# Patient Record
Sex: Male | Born: 1935 | Race: White | Hispanic: No
Health system: Southern US, Community
[De-identification: ages and names within clinical notes are randomized; demographics above are authoritative.]

## PROBLEM LIST (undated history)

## (undated) DIAGNOSIS — Z992 Dependence on renal dialysis: Secondary | ICD-10-CM

## (undated) DIAGNOSIS — N289 Disorder of kidney and ureter, unspecified: Secondary | ICD-10-CM

## (undated) DIAGNOSIS — C801 Malignant (primary) neoplasm, unspecified: Secondary | ICD-10-CM

## (undated) DIAGNOSIS — I10 Essential (primary) hypertension: Secondary | ICD-10-CM

---

## 1998-09-05 ENCOUNTER — Emergency Department (HOSPITAL_COMMUNITY): Admission: EM | Admit: 1998-09-05 | Discharge: 1998-09-05 | Payer: Self-pay | Admitting: *Deleted

## 1998-09-07 ENCOUNTER — Inpatient Hospital Stay (HOSPITAL_COMMUNITY): Admission: EM | Admit: 1998-09-07 | Discharge: 1998-09-12 | Payer: Self-pay | Admitting: Emergency Medicine

## 1998-09-07 ENCOUNTER — Encounter: Payer: Self-pay | Admitting: Internal Medicine

## 2000-12-31 ENCOUNTER — Inpatient Hospital Stay (HOSPITAL_COMMUNITY): Admission: AD | Admit: 2000-12-31 | Discharge: 2001-01-03 | Payer: Self-pay | Admitting: Internal Medicine

## 2001-01-03 ENCOUNTER — Encounter: Payer: Self-pay | Admitting: Internal Medicine

## 2002-06-21 ENCOUNTER — Emergency Department (HOSPITAL_COMMUNITY): Admission: EM | Admit: 2002-06-21 | Discharge: 2002-06-21 | Payer: Self-pay | Admitting: Emergency Medicine

## 2002-06-21 ENCOUNTER — Encounter: Payer: Self-pay | Admitting: Emergency Medicine

## 2003-08-05 ENCOUNTER — Encounter: Payer: Self-pay | Admitting: Emergency Medicine

## 2003-08-05 ENCOUNTER — Inpatient Hospital Stay (HOSPITAL_COMMUNITY): Admission: EM | Admit: 2003-08-05 | Discharge: 2003-08-07 | Payer: Self-pay | Admitting: Emergency Medicine

## 2003-08-07 ENCOUNTER — Encounter: Payer: Self-pay | Admitting: Cardiology

## 2005-04-03 ENCOUNTER — Encounter (HOSPITAL_COMMUNITY): Admission: RE | Admit: 2005-04-03 | Discharge: 2005-07-01 | Payer: Self-pay | Admitting: Nephrology

## 2005-05-13 ENCOUNTER — Ambulatory Visit: Payer: Self-pay | Admitting: Gastroenterology

## 2005-05-29 ENCOUNTER — Ambulatory Visit: Payer: Self-pay | Admitting: Gastroenterology

## 2005-06-22 ENCOUNTER — Ambulatory Visit: Payer: Self-pay | Admitting: Surgery

## 2005-06-22 ENCOUNTER — Other Ambulatory Visit: Payer: Self-pay

## 2005-07-07 ENCOUNTER — Ambulatory Visit: Payer: Self-pay | Admitting: Cardiology

## 2005-07-16 ENCOUNTER — Ambulatory Visit: Payer: Self-pay | Admitting: Surgery

## 2005-07-20 ENCOUNTER — Inpatient Hospital Stay: Payer: Self-pay | Admitting: Surgery

## 2005-08-14 ENCOUNTER — Ambulatory Visit: Payer: Self-pay | Admitting: Family Medicine

## 2005-12-09 ENCOUNTER — Ambulatory Visit: Payer: Self-pay | Admitting: Family Medicine

## 2005-12-14 ENCOUNTER — Ambulatory Visit: Payer: Self-pay | Admitting: Urology

## 2005-12-29 ENCOUNTER — Ambulatory Visit: Payer: Self-pay | Admitting: Urology

## 2006-01-01 ENCOUNTER — Ambulatory Visit: Payer: Self-pay | Admitting: Urology

## 2006-03-24 ENCOUNTER — Encounter: Payer: Self-pay | Admitting: Neurology

## 2006-03-24 ENCOUNTER — Encounter: Payer: Self-pay | Admitting: Emergency Medicine

## 2006-07-23 ENCOUNTER — Ambulatory Visit: Payer: Self-pay

## 2006-12-29 ENCOUNTER — Other Ambulatory Visit: Payer: Self-pay

## 2006-12-29 ENCOUNTER — Emergency Department: Payer: Self-pay | Admitting: Emergency Medicine

## 2007-01-03 ENCOUNTER — Other Ambulatory Visit: Payer: Self-pay

## 2007-01-03 ENCOUNTER — Emergency Department: Payer: Self-pay | Admitting: Internal Medicine

## 2007-01-20 ENCOUNTER — Emergency Department: Payer: Self-pay | Admitting: Emergency Medicine

## 2007-01-20 ENCOUNTER — Other Ambulatory Visit: Payer: Self-pay

## 2007-01-21 ENCOUNTER — Other Ambulatory Visit: Payer: Self-pay

## 2007-01-21 ENCOUNTER — Inpatient Hospital Stay: Payer: Self-pay | Admitting: Internal Medicine

## 2007-06-16 ENCOUNTER — Ambulatory Visit: Payer: Self-pay | Admitting: Orthopaedic Surgery

## 2007-07-07 ENCOUNTER — Ambulatory Visit: Payer: Self-pay | Admitting: Pain Medicine

## 2007-07-11 ENCOUNTER — Ambulatory Visit: Payer: Self-pay | Admitting: Pain Medicine

## 2007-09-02 ENCOUNTER — Ambulatory Visit: Payer: Self-pay | Admitting: Pain Medicine

## 2007-09-12 ENCOUNTER — Ambulatory Visit: Payer: Self-pay | Admitting: Pain Medicine

## 2007-10-11 ENCOUNTER — Ambulatory Visit: Payer: Self-pay | Admitting: Pain Medicine

## 2007-10-18 ENCOUNTER — Ambulatory Visit: Payer: Self-pay | Admitting: Family Medicine

## 2007-11-07 ENCOUNTER — Ambulatory Visit: Payer: Self-pay | Admitting: Pain Medicine

## 2007-12-12 ENCOUNTER — Encounter: Payer: Self-pay | Admitting: Internal Medicine

## 2007-12-25 ENCOUNTER — Encounter: Payer: Self-pay | Admitting: Internal Medicine

## 2008-03-28 ENCOUNTER — Ambulatory Visit: Payer: Self-pay | Admitting: Pain Medicine

## 2008-08-30 IMAGING — CR RIGHT HIP - COMPLETE 2+ VIEW
1 series · 2 of 2 positions shown · non-contrast
Comparison: none

REASON FOR EXAM: pain
COMMENTS:

PROCEDURE:     DXR - DXR HIP RIGHT COMPLETE  - December 29, 2006 [DATE]
RESULT:     Degenerative change is appreciated involving the RIGHT hip.
There does not appear to be evidence of fracture, dislocation or
malalignment.  There is osteophytosis along the acetabulum.

[Series 1: view not recorded · 0.17mm/px · 2 of 2 slices shown]
[im 1/2]
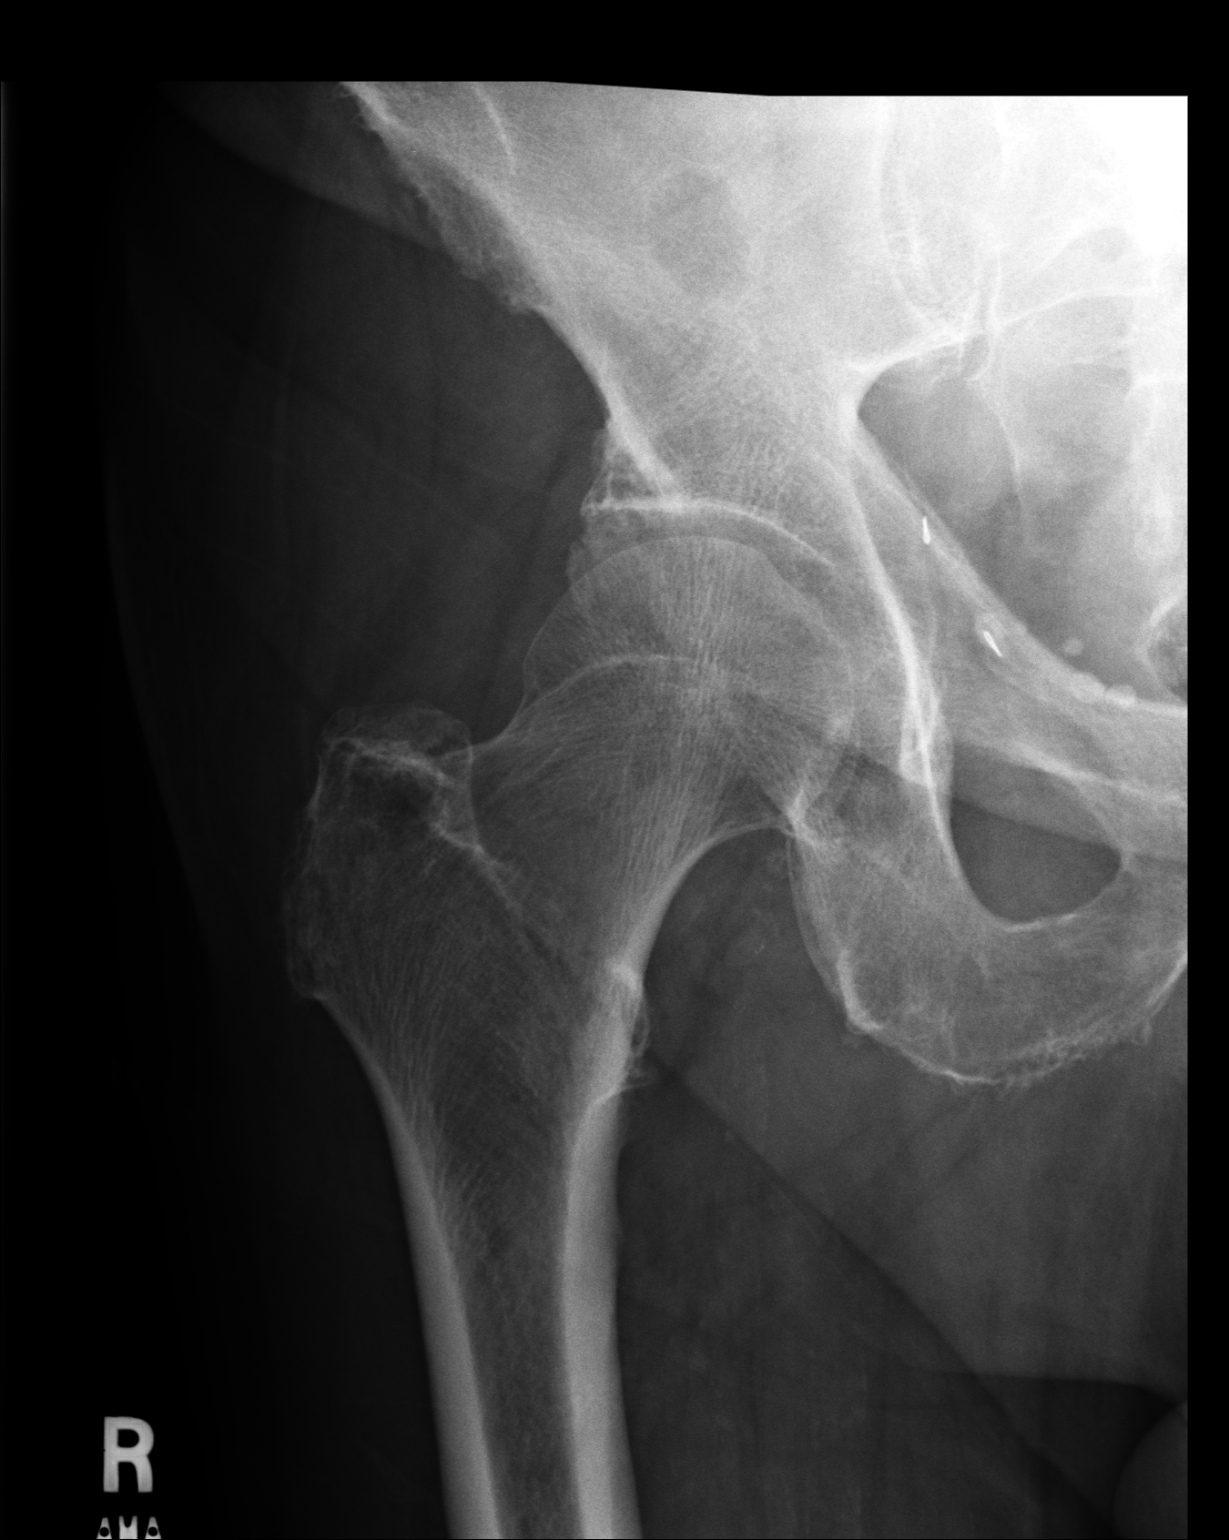
[im 2/2]
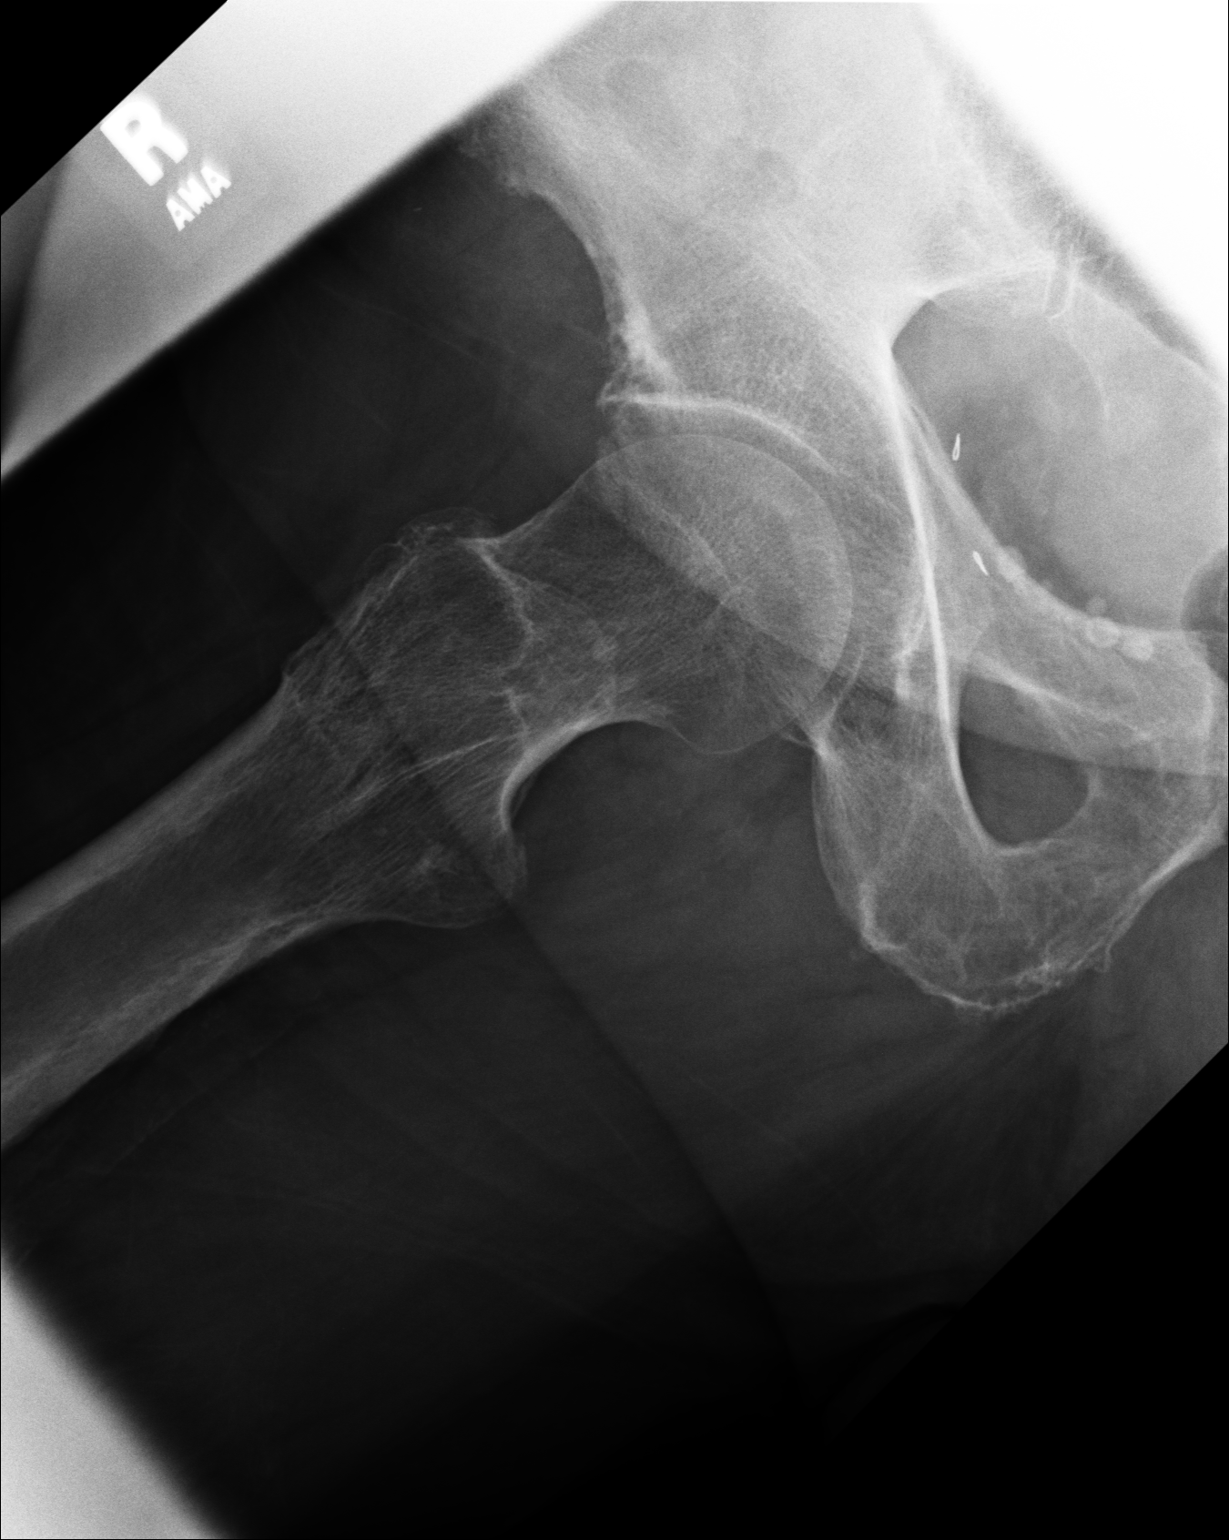

[2 of 2 positions shown; findings below may reference images not displayed]

IMPRESSION: 1)Degenerative changes without evidence of focal or acute osseous
abnormalities. If there are persistent complaints of pain or persistent
clinical concern, further evaluation with hip fracture protocol MRI is
recommended if clinically warranted.

## 2008-08-30 IMAGING — CT CT HEAD WITHOUT CONTRAST
2 series · 16 of 30 positions shown, 20 images · non-contrast
Comparison: none

REASON FOR EXAM: Right leg weakness
COMMENTS:

PROCEDURE:     CT  - CT HEAD WITHOUT CONTRAST  - December 29, 2006 [DATE]
RESULT:     Noncontrast head CT scan is performed.

[Series 2: without · axial · non-contrast · 0.40mm/px · z∈[-122,-2]mm · 13 of 28 slices shown, 17 images]
[im 2/28  brain]
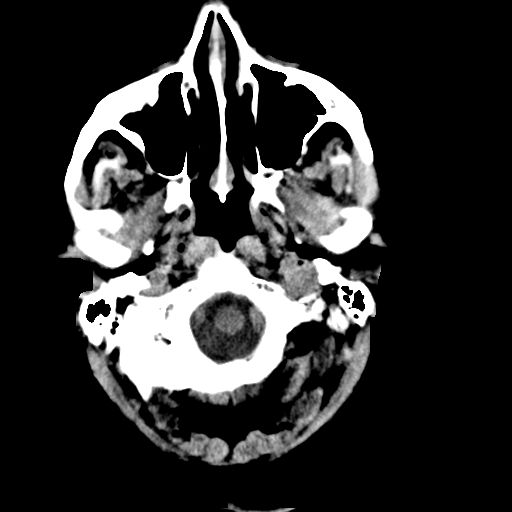
[im 2/28  bone]
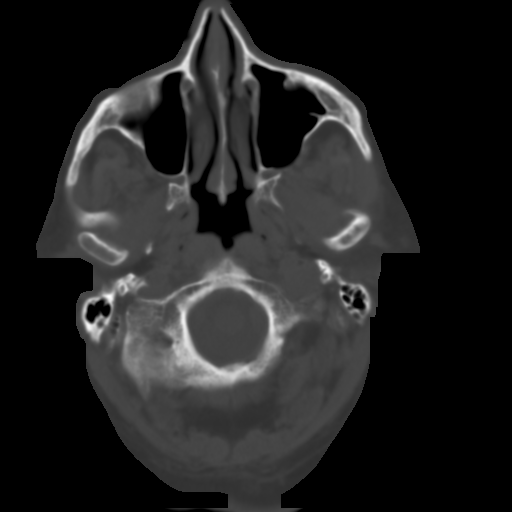
[im 4/28  brain]
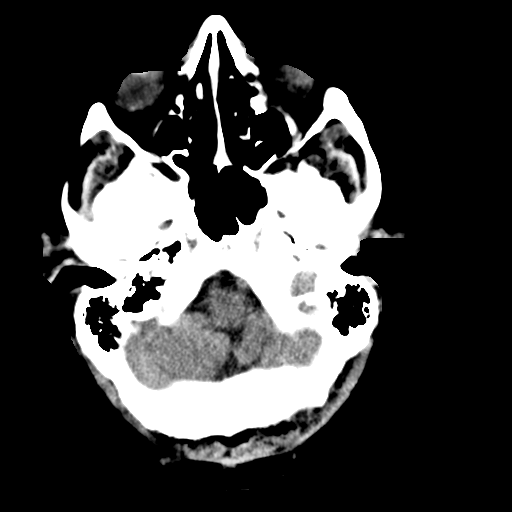
[im 6/28  brain]
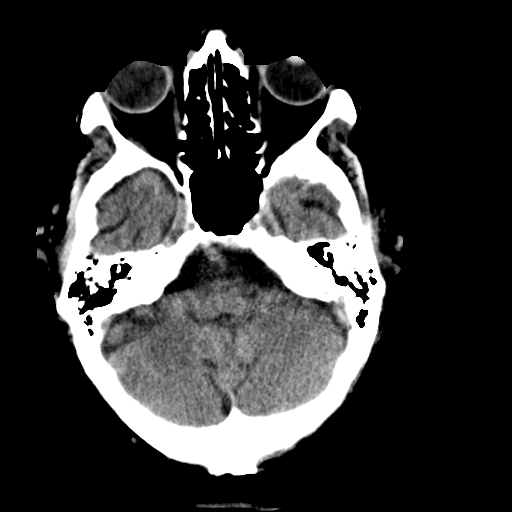
[im 8/28  brain]
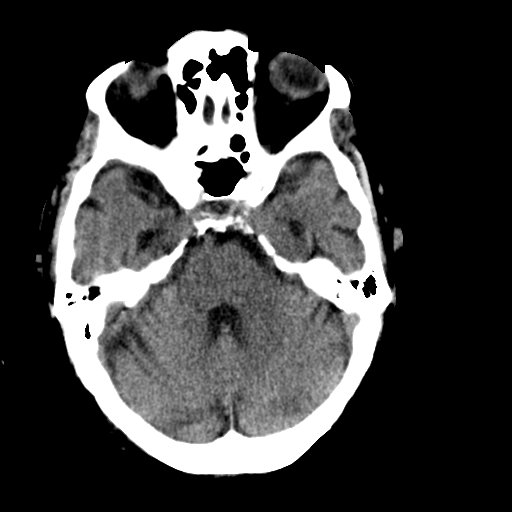
[im 10/28  brain]
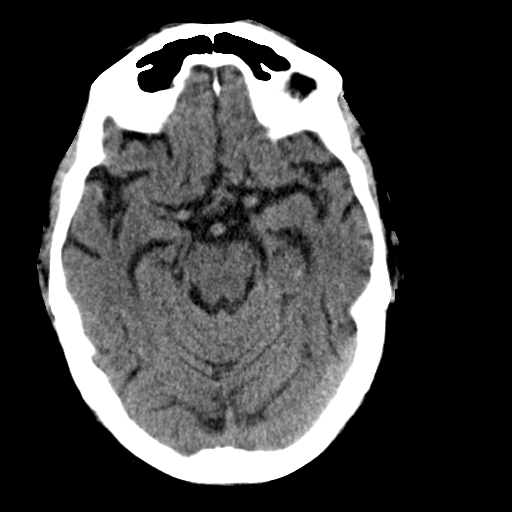
[im 10/28  bone]
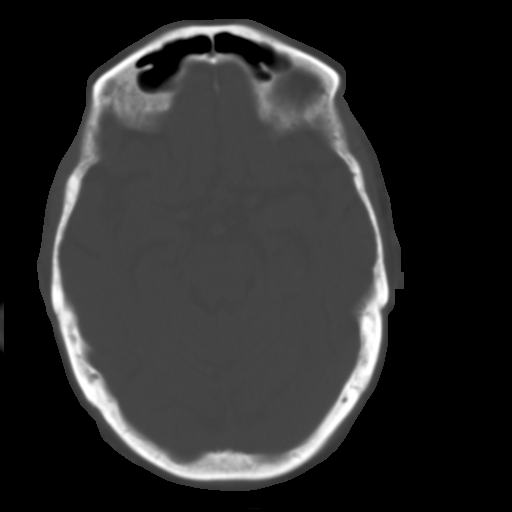
[im 12/28  brain]
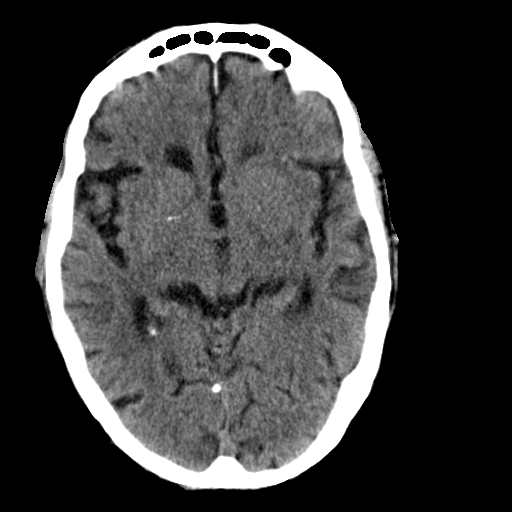
[im 14/28  brain]
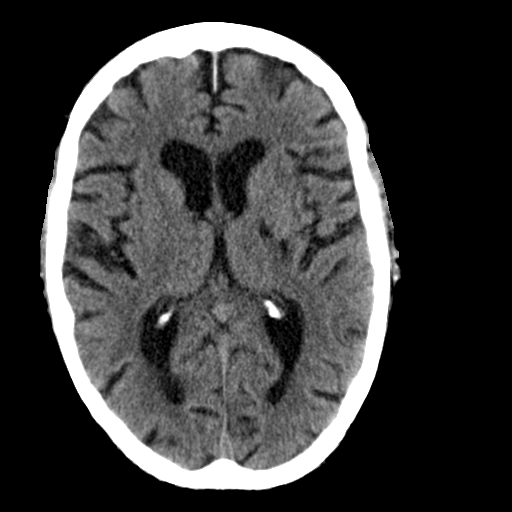
[im 16/28  brain]
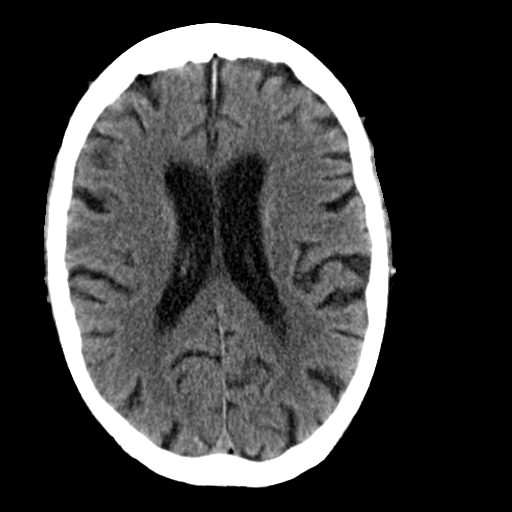
[im 18/28  brain]
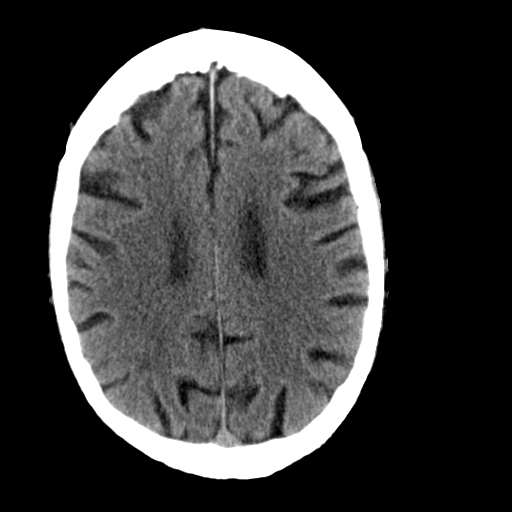
[im 18/28  bone]
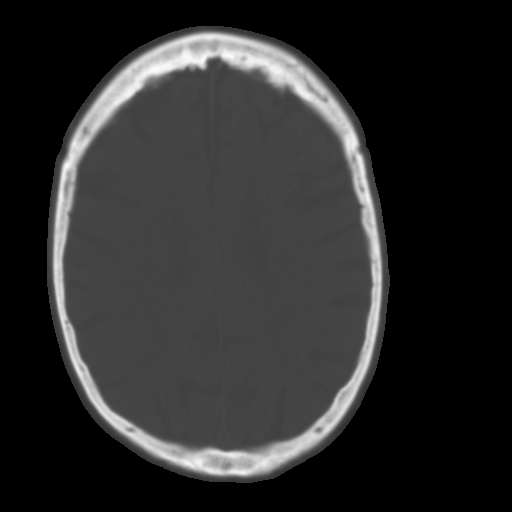
[im 20/28  brain]
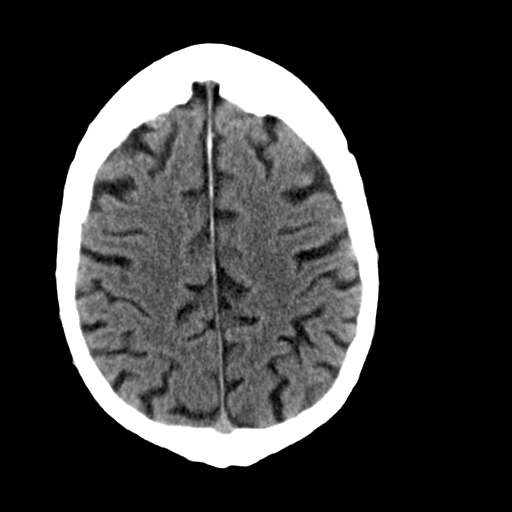
[im 22/28  brain]
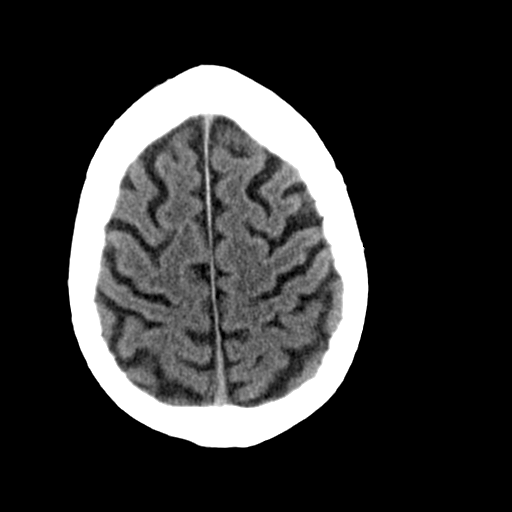
[im 24/28  brain]
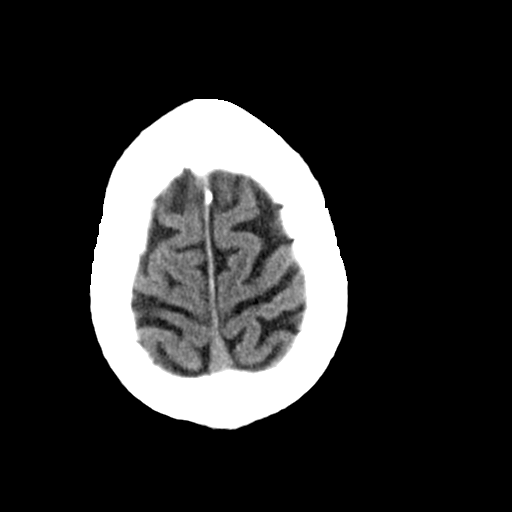
[im 26/28  brain]
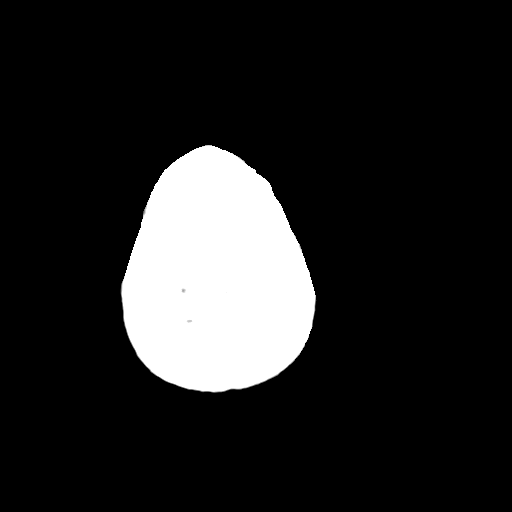
[im 26/28  bone]
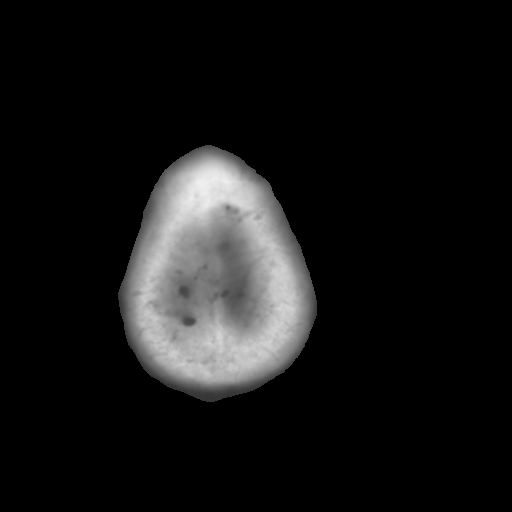

[Series 3: bone · axial · 0.40mm/px · z∈[-122,-82]mm · 3 of 28 slices shown]
[im 2/28  bone]
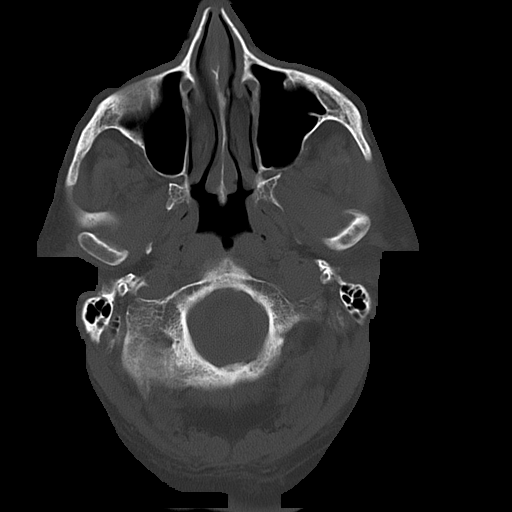
[im 6/28  bone]
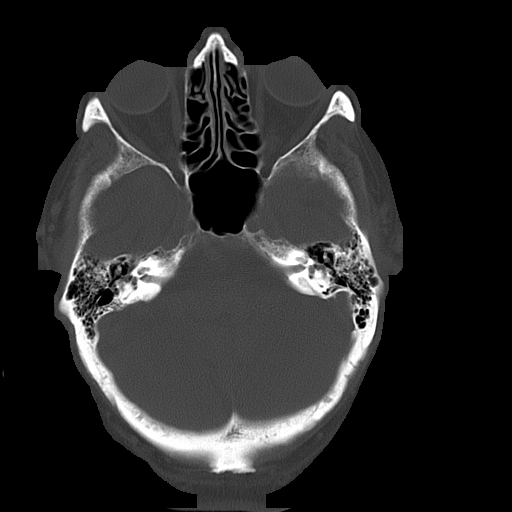
[im 10/28  bone]
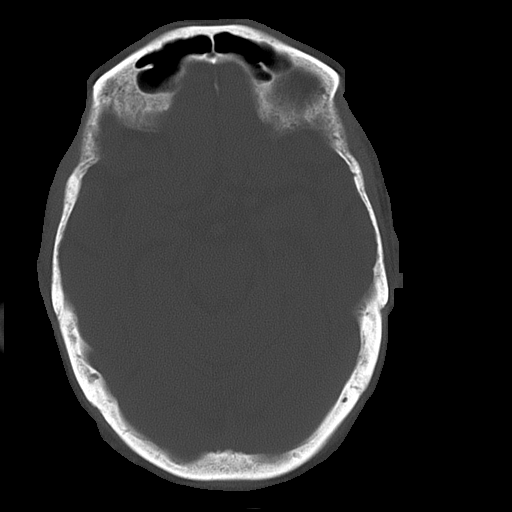

[16 of 30 positions shown; findings below may reference images not displayed]

FINDINGS: No acute bleeds or infarcts are identified. There are old, lacune
infarcts in the LEFT basal ganglia. There is no mass effect or shift of the
midline. There are noted changes of atrophy. No extra-axial fluid
collections are noted.

On the bone window settings, the mastoids and sinuses appear clear. No
obvious abnormalities are detected.
IMPRESSION: 1.     No acute abnormality is seen.
2.     Old, lacune infarcts in the LEFT basal ganglia.
3.     Changes of atrophy.

The report was called to Dr. Jhemboy in the Emergency Room at the conclusion
of dictation.

## 2008-10-07 ENCOUNTER — Inpatient Hospital Stay: Payer: Self-pay | Admitting: Internal Medicine

## 2008-10-31 ENCOUNTER — Ambulatory Visit: Payer: Self-pay | Admitting: Nephrology

## 2008-11-08 ENCOUNTER — Ambulatory Visit: Payer: Self-pay | Admitting: Pain Medicine

## 2009-10-09 ENCOUNTER — Inpatient Hospital Stay: Payer: Self-pay | Admitting: Internal Medicine

## 2009-10-21 ENCOUNTER — Emergency Department: Payer: Self-pay | Admitting: Emergency Medicine

## 2010-05-26 ENCOUNTER — Emergency Department: Payer: Self-pay | Admitting: Emergency Medicine

## 2011-03-12 ENCOUNTER — Ambulatory Visit: Payer: Self-pay | Admitting: Pain Medicine

## 2011-03-18 ENCOUNTER — Ambulatory Visit: Payer: Self-pay | Admitting: Pain Medicine

## 2011-04-07 ENCOUNTER — Other Ambulatory Visit (HOSPITAL_COMMUNITY): Payer: Self-pay | Admitting: MOHS-Micrographic Surgery

## 2011-04-07 DIAGNOSIS — IMO0002 Reserved for concepts with insufficient information to code with codable children: Secondary | ICD-10-CM

## 2011-04-16 ENCOUNTER — Ambulatory Visit (HOSPITAL_COMMUNITY)
Admission: RE | Admit: 2011-04-16 | Discharge: 2011-04-16 | Disposition: A | Discharge status: Home / Self Care | Payer: Medicare Other | Source: Ambulatory Visit | Attending: MOHS-Micrographic Surgery | Admitting: MOHS-Micrographic Surgery

## 2011-04-16 ENCOUNTER — Encounter (HOSPITAL_COMMUNITY): Payer: Self-pay

## 2011-04-16 ENCOUNTER — Other Ambulatory Visit (HOSPITAL_COMMUNITY): Payer: Self-pay | Admitting: MOHS-Micrographic Surgery

## 2011-04-16 DIAGNOSIS — IMO0002 Reserved for concepts with insufficient information to code with codable children: Secondary | ICD-10-CM

## 2011-04-16 DIAGNOSIS — M47812 Spondylosis without myelopathy or radiculopathy, cervical region: Secondary | ICD-10-CM | POA: Insufficient documentation

## 2011-04-16 DIAGNOSIS — C4442 Squamous cell carcinoma of skin of scalp and neck: Secondary | ICD-10-CM | POA: Insufficient documentation

## 2011-04-16 DIAGNOSIS — G319 Degenerative disease of nervous system, unspecified: Secondary | ICD-10-CM | POA: Insufficient documentation

## 2011-04-16 DIAGNOSIS — I7090 Unspecified atherosclerosis: Secondary | ICD-10-CM | POA: Insufficient documentation

## 2011-04-16 DIAGNOSIS — Z8673 Personal history of transient ischemic attack (TIA), and cerebral infarction without residual deficits: Secondary | ICD-10-CM | POA: Insufficient documentation

## 2011-04-16 DIAGNOSIS — M47814 Spondylosis without myelopathy or radiculopathy, thoracic region: Secondary | ICD-10-CM | POA: Insufficient documentation

## 2011-04-16 HISTORY — DX: Essential (primary) hypertension: I10

## 2011-04-16 HISTORY — DX: Malignant (primary) neoplasm, unspecified: C80.1

## 2011-04-16 HISTORY — DX: Dependence on renal dialysis: Z99.2

## 2011-04-16 HISTORY — DX: Disorder of kidney and ureter, unspecified: N28.9

## 2011-04-16 MED ORDER — IOHEXOL 300 MG/ML  SOLN
75.0000 mL | Freq: Once | INTRAMUSCULAR | Status: AC | PRN
  Administered 2011-04-16: 75 mL via INTRAVENOUS

## 2011-05-11 ENCOUNTER — Inpatient Hospital Stay: Payer: Self-pay | Admitting: Internal Medicine

## 2011-07-20 ENCOUNTER — Emergency Department: Payer: Self-pay | Admitting: Emergency Medicine

## 2011-07-25 ENCOUNTER — Ambulatory Visit: Payer: Self-pay | Admitting: Internal Medicine

## 2011-08-10 ENCOUNTER — Inpatient Hospital Stay: Payer: Self-pay | Admitting: Internal Medicine

## 2011-08-24 ENCOUNTER — Ambulatory Visit: Payer: Self-pay | Admitting: Internal Medicine

## 2011-11-26 ENCOUNTER — Emergency Department: Payer: Self-pay | Admitting: Emergency Medicine

## 2011-11-27 ENCOUNTER — Emergency Department: Payer: Self-pay | Admitting: Unknown Physician Specialty

## 2012-02-17 ENCOUNTER — Other Ambulatory Visit: Payer: Self-pay | Admitting: Nephrology

## 2012-04-04 ENCOUNTER — Inpatient Hospital Stay: Payer: Self-pay | Admitting: Specialist

## 2012-04-04 LAB — CBC
HCT: 31 % — ABNORMAL LOW (ref 40.0–52.0)
MCV: 105 fL — ABNORMAL HIGH (ref 80–100)
RBC: 2.94 10*6/uL — ABNORMAL LOW (ref 4.40–5.90)
RDW: 14.5 % (ref 11.5–14.5)
WBC: 9.3 10*3/uL (ref 3.8–10.6)

## 2012-04-04 LAB — COMPREHENSIVE METABOLIC PANEL
Alkaline Phosphatase: 73 U/L (ref 50–136)
Anion Gap: 11 (ref 7–16)
BUN: 23 mg/dL — ABNORMAL HIGH (ref 7–18)
Bilirubin,Total: 0.5 mg/dL (ref 0.2–1.0)
Calcium, Total: 8.6 mg/dL (ref 8.5–10.1)
Chloride: 96 mmol/L — ABNORMAL LOW (ref 98–107)
Co2: 31 mmol/L (ref 21–32)
Creatinine: 3.28 mg/dL — ABNORMAL HIGH (ref 0.60–1.30)
EGFR (African American): 18 — ABNORMAL LOW
EGFR (Non-African Amer.): 16 — ABNORMAL LOW
Glucose: 78 mg/dL (ref 65–99)
Osmolality: 278 (ref 275–301)
Potassium: 3.9 mmol/L (ref 3.5–5.1)
SGPT (ALT): 12 U/L
Total Protein: 7.5 g/dL (ref 6.4–8.2)

## 2012-04-04 LAB — AMMONIA: Ammonia, Plasma: <25 mcmol/L (ref 11–32)

## 2012-04-04 LAB — TROPONIN I: Troponin-I: <0.02 ng/mL

## 2012-04-04 LAB — CK TOTAL AND CKMB (NOT AT ARMC)
CK, Total: 49 U/L (ref 35–232)
CK-MB: 1.2 ng/mL (ref 0.5–3.6)

## 2012-04-04 LAB — APTT: Activated PTT: 49.6 secs — ABNORMAL HIGH (ref 23.6–35.9)

## 2012-04-04 LAB — PROTIME-INR: INR: 1.7

## 2012-04-05 LAB — CBC WITH DIFFERENTIAL/PLATELET
Basophil #: 0.1 10*3/uL (ref 0.0–0.1)
Basophil %: 0.7 %
Eosinophil #: 0.1 10*3/uL (ref 0.0–0.7)
Eosinophil %: 1.4 %
HCT: 29.4 % — ABNORMAL LOW (ref 40.0–52.0)
HGB: 9.8 g/dL — ABNORMAL LOW (ref 13.0–18.0)
Lymphocyte #: 1 10*3/uL (ref 1.0–3.6)
Lymphocyte %: 12 %
MCH: 35.1 pg — ABNORMAL HIGH (ref 26.0–34.0)
MCHC: 33.2 g/dL (ref 32.0–36.0)
MCV: 106 fL — ABNORMAL HIGH (ref 80–100)
Monocyte %: 11.1 %
Neutrophil #: 6.5 10*3/uL (ref 1.4–6.5)
Platelet: 313 10*3/uL (ref 150–440)
RBC: 2.78 10*6/uL — ABNORMAL LOW (ref 4.40–5.90)
RDW: 14.1 % (ref 11.5–14.5)
WBC: 8.7 10*3/uL (ref 3.8–10.6)

## 2012-04-05 LAB — BASIC METABOLIC PANEL
BUN: 31 mg/dL — ABNORMAL HIGH (ref 7–18)
Co2: 30 mmol/L (ref 21–32)
Creatinine: 4.31 mg/dL — ABNORMAL HIGH (ref 0.60–1.30)
Glucose: 88 mg/dL (ref 65–99)
Potassium: 3.8 mmol/L (ref 3.5–5.1)
Sodium: 136 mmol/L (ref 136–145)

## 2012-04-05 LAB — TSH: Thyroid Stimulating Horm: 1.51 u[IU]/mL

## 2012-04-10 LAB — CULTURE, BLOOD (SINGLE)

## 2012-05-23 DEATH — deceased

## 2012-12-16 IMAGING — CT CT HEAD WO/W CM
1 series · 15 of 30 positions shown, 19 images · IV contrast (omnipaque)
Comparison: None.

CT HEAD

CLINICAL DATA: Squamous cell carcinoma removed from the scalp.
199.1.

CT HEAD WITHOUT AND WITH CONTRAST
CT NECK WITH CONTRAST
TECHNIQUE: Contiguous axial images were obtained from the base of
the skull through the vertex without and with intravenous contrast.
Multidetector CT imaging of the neck was performed using the
standard protocol following the bolus administration of intravenous
contrast.
Contrast: 75 ml Omnipaque 300

[Series 2: head routine · axial · 0.43mm/px · z∈[+1166,+1298]mm · 15 of 30 slices shown, 19 images]
[im 2/30  brain]
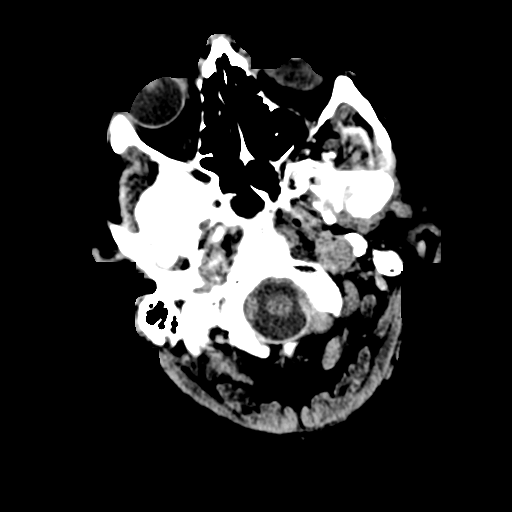
[im 2/30  bone]
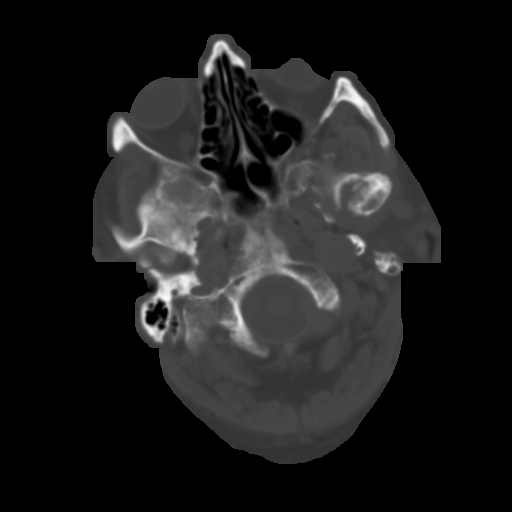
[im 4/30  brain]
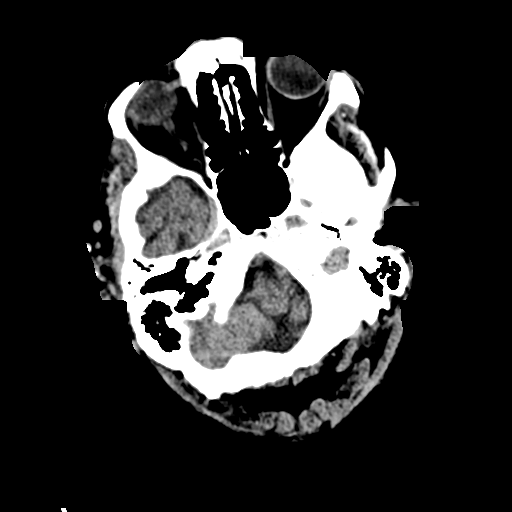
[im 6/30  brain]
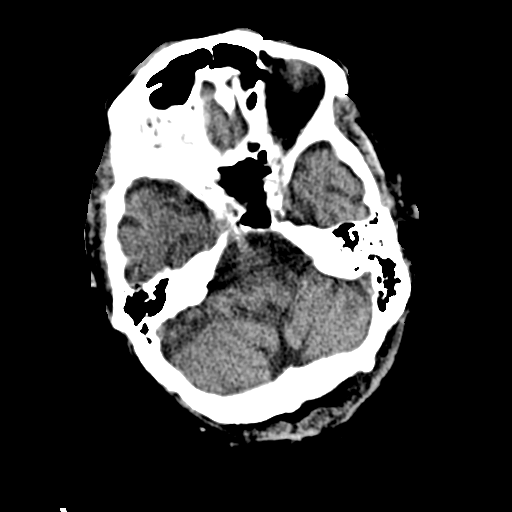
[im 8/30  brain]
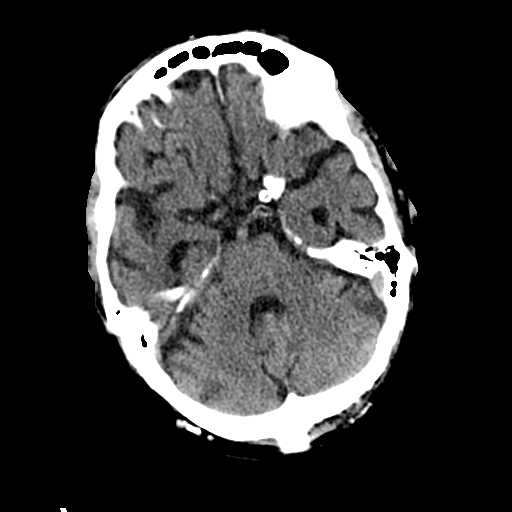
[im 10/30  brain]
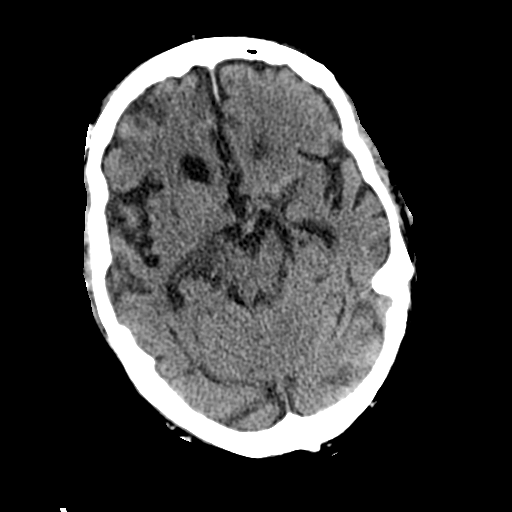
[im 10/30  bone]
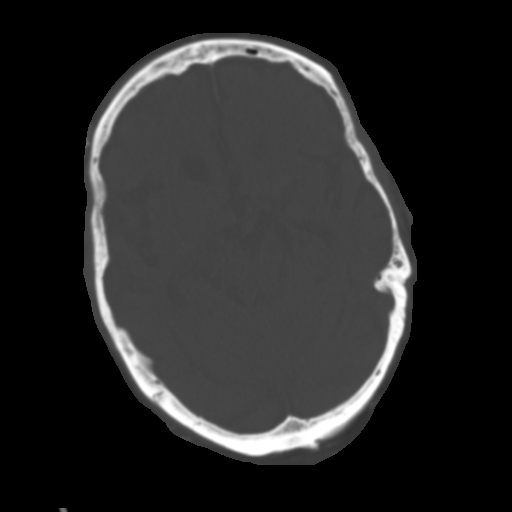
[im 12/30  brain]
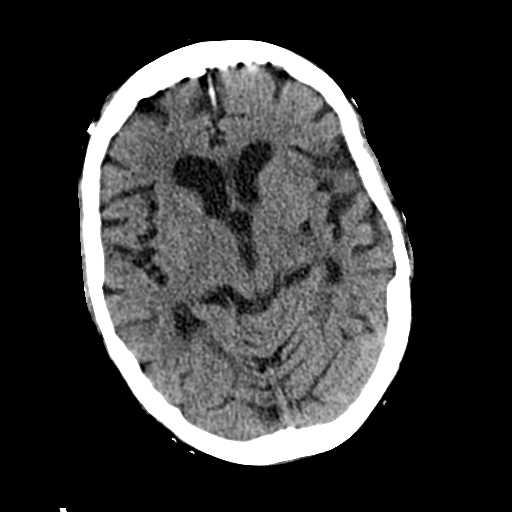
[im 14/30  brain]
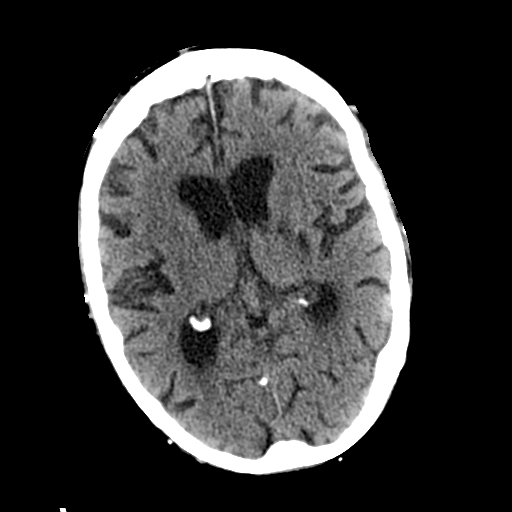
[im 16/30  brain]
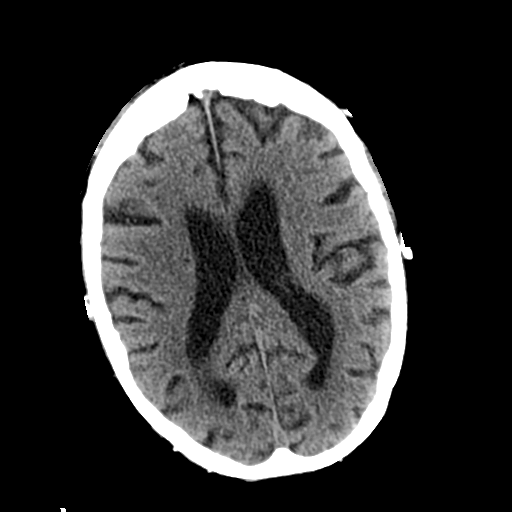
[im 17/30  brain]
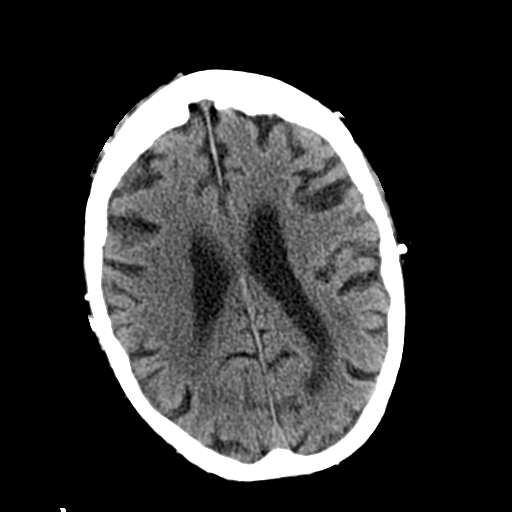
[im 17/30  bone]
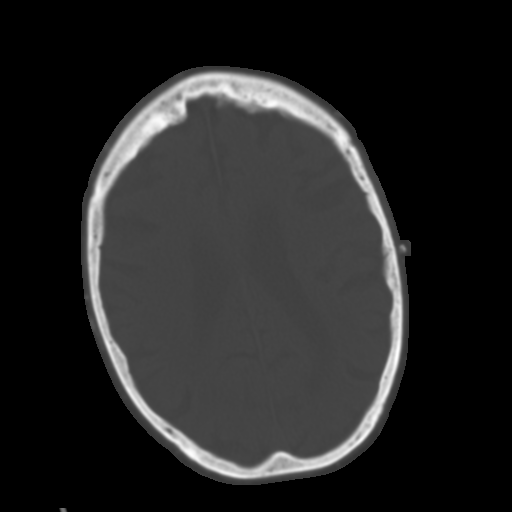
[im 19/30  brain]
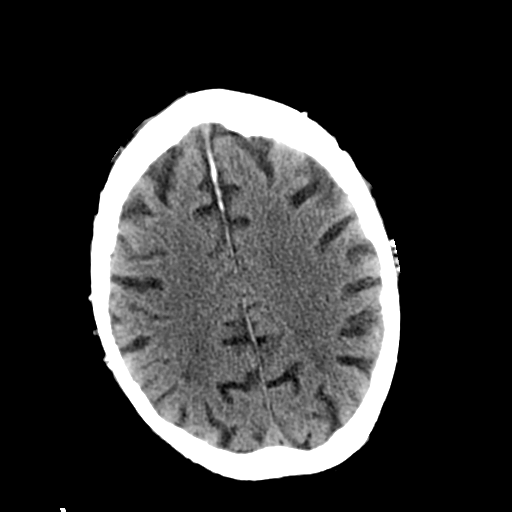
[im 21/30  brain]
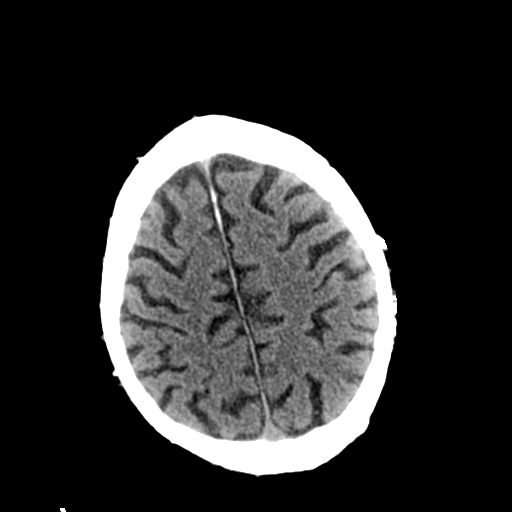
[im 23/30  brain]
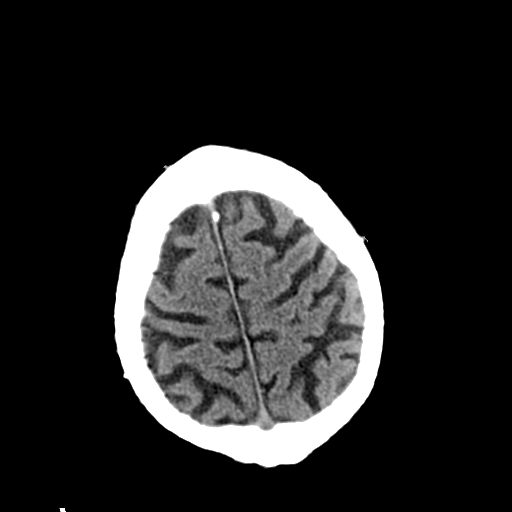
[im 25/30  brain]
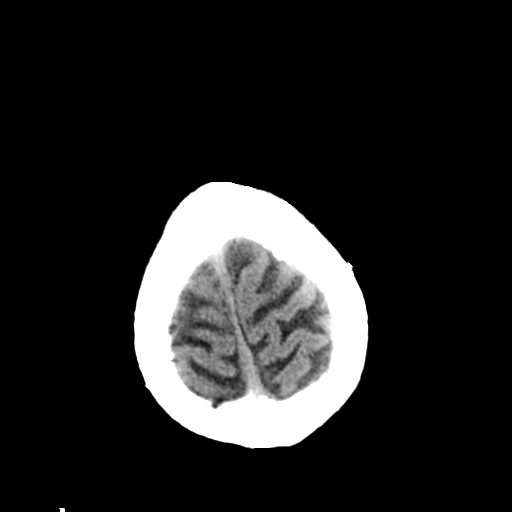
[im 25/30  bone]
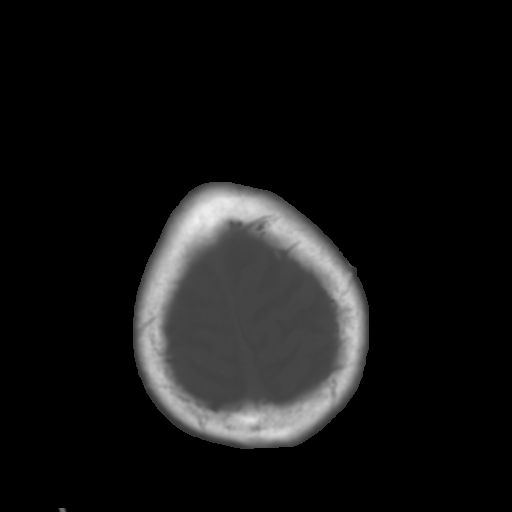
[im 27/30  brain]
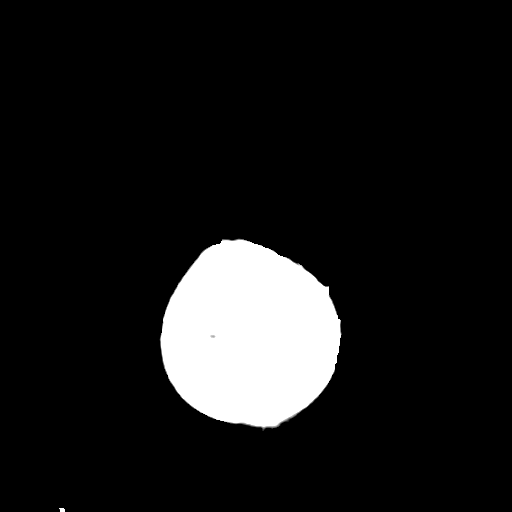
[im 29/30  brain]
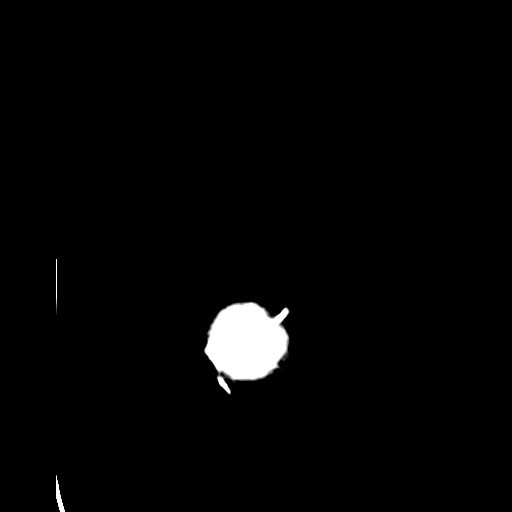

[15 of 30 positions shown; findings below may reference images not displayed]

FINDINGS: A remote lacunar infarcts are present in the left
lentiform nucleus and internal capsule.  Mild generalized atrophy
is likely within normal limits for age.  Mild white matter
hypoattenuation is present as well.  No acute cortical infarct,
hemorrhage, or mass lesion is present.

The postcontrast images demonstrate no focal areas of pathologic
enhancement to suggest metastatic disease.
IMPRESSION: 1.  No evidence for metastatic disease of the brain.
2.  Mild generalized atrophy is likely within normal limits for
age.
3.  Remote lacunar infarcts of the left basal ganglia.

CT NECK
FINDINGS: No focal mucosal or submucosal lesions are evident.  The
glottis is wide open.  No asymmetry is evident.

No pathologic lymph nodes are identified.  Atherosclerotic
calcifications are present in the proximal internal carotid
arteries bilaterally without significant stenosis by NASCET
criteria.  The proximal great vessels are tortuous, but without
focal stenosis.  The visualized lung apices are clear.

Diffuse endplate change and posterior osteophytes are present at C5-
6.  Asymmetric left-sided uncovertebral disease is present at C4-5.
Fused anterior osteophytes are noted within the upper thoracic
spine.
IMPRESSION: 1.  No evidence of metastatic disease to the neck.

2.  Atherosclerotic changes.
3.  Mild spondylosis of the cervical and upper thoracic spine as
described.

## 2015-03-17 NOTE — H&P (Signed)
PATIENT NAME:  Christian Short, WEBER MR#:  [id number] DATE OF BIRTH:  [birth year]  DATE OF ADMISSION:  04/04/2012  PRIMARY CARE PHYSICIAN: Dr. Apolonio Schneiders.   CHIEF COMPLAINT: Altered mental status.  HISTORY OF PRESENT ILLNESS:  An 81 year old male with a history of congestive heart failure, atrial fibrillation, dementia, diabetes, history of cerebrovascular accident, end-stage renal disease on hemodialysis, prostate cancer and questionable pancreatic mass who presents with the above complaint. The patient went  to dialysis today where they found him very lethargic. He was hypotensive and he had a decrease in his oxygen saturation. He also was very weak so he was brought here for further evaluation. In the Emergency Room, he had a chest x-ray which shows right pleural effusion. He does seem more disoriented from his baseline per the family. There is no fever, chills, or recent illness. No pulmonary symptoms such as cough or shortness of breath. Because the patient is a dialysis patient he makes very little urine. A urine sample is pending.   REVIEW OF SYSTEMS:  CONSTITUTIONAL: No fever. Positive fatigue and weakness. EYES: No blurred or double vision. ENT: Positive hearing loss. RESPIRATORY: No cough. No wheezing. No dyspnea. CARDIOVASCULAR: No chest pain, orthopnea, edema, palpitations, or syncope. GASTROINTESTINAL: No nausea, vomiting, diarrhea, abdominal pain, melena, or ulcers. GENITOURINARY: The patient makes very little urine. ENDOCRINE: No thyroid problems or increased sweating, polyuria or polydipsia. HEME/LYMPH: Positive anemia of chronic disease. SKIN: No rash or lesions. MUSCULOSKELETAL: Limited activity. He uses a walker. NEUROLOGIC: Positive history of cerebrovascular accident. PSYCH: Dementia and depression.   PAST MEDICAL HISTORY:  1. Chronic heart failure.  2. Questionable pancreatic mass. The family wished no further work-up.  3. End-stage renal disease on hemodialysis Monday, Wednesday,  Friday.  4. Hypertension.  5. History of diabetes.  6. Hyperlipidemia.  7. History of cerebrovascular accident.  8. History of prostate cancer status post prostatectomy.  9. Depression.  10. History of coronary artery disease. Cardiac catheterization in 2006 reveals 60% mid LAD, 40% RCA. Ejection fraction of 50%.  11. Atrial fibrillation, unable to tolerate Coumadin due to nosebleeds.  12. Chronic dizziness due to BPD.   PAST SURGICAL HISTORY:  1. Prostatectomy.  2. Left arm AV fistula.   MEDICATIONS:  1. Aspirin 81 mg daily.  2. Januvia 50 mg daily.  3. Renvela 1 tablet t.i.d.  4. Docusate 100 mg b.i.d. p.r.n.  5. Zoloft 150 mg daily.  6. Vitamin D2 50,000 international units daily.  7. Atorvastatin 40 mg daily. 8. Celebrex 200 mg daily.  9. Omeprazole 20 mg daily.  10. Oyster shell calcium 500 mg daily.  11. Nephrocaps 1 tablet daily.  12. Norvasc 5 mg daily.  13. Fish Oil 1000 mg b.i.d.  14. Gemfibrozil 600 b.i.d.  15. Metoprolol 50 mg daily.   SOCIAL HISTORY: No tobacco, alcohol, or drug use. He is a resident of [facility].   ALLERGIES: Opiates, Flexeril.   FAMILY HISTORY: Positive for diabetes and hypertension.   PHYSICAL EXAMINATION:  VITAL SIGNS: Temperature 97.9, pulse 74, respirations 20, blood pressure 101/61, 96% on room air.   GENERAL: The patient is alert. He is oriented to name, not place or time.   HEENT: Head is atraumatic. Pupils are round and reactive. Sclerae anicteric. Mucous membranes are dry. Oropharynx is clear.   NECK: Supple without jugular venous distention, carotid bruit or enlarged thyroid.   CARDIOVASCULAR: Regular rate and rhythm. No murmurs, gallops, or rubs. PMI is nondisplaced.   LUNGS: Clear to auscultation without  rales, crackles, or wheezing. He has decreased breath sounds at the right base.   BACK: No CVA or vertebral tenderness.   ABDOMEN: Bowel sounds positive. Nontender, nondistended. No hepatosplenomegaly.    EXTREMITIES: No clubbing, cyanosis, or edema.   NEUROLOGIC: Cranial nerves II through XII are intact. There are no focal deficits. Finger-to-nose is intact. Heel-to-shin is intact. Strength: Five out of five strength in all extremities.   SKIN: Without rashes or lesions. He has some abrasions on his face.   LABORATORY, RADIOLOGICAL AND DIAGNOSTIC DATA: ABG: PH 7.41. This is from a venous, pCO2 54, ammonia less than 25, white blood cells 9.3, hemoglobin 10.5, hematocrit 31, platelets 333, sodium 138, potassium 3.9, chloride 96, bicarbonate 31, BUN 23, creatinine 3.28, glucose 78, calcium 8.6, bilirubin 0.5, alkaline phosphatase 73, AST 23, ALT 12, total protein 7.6, albumin 2.6. INR is 1.7. CK 49, CK-MB 1.2, troponin less than 0.02. Chest x-ray shows a moderate size right pleural effusion. CT of the head shows no acute intracranial hemorrhage or cerebrovascular accident. EKG shows atrial fibrillation with competing junctional pacemaker.   ASSESSMENT AND PLAN: An 81 year old male with a history of congestive heart failure, atrial fibrillation, not on Coumadin, end-stage renal disease on hemodialysis who presents with altered mental status and hypotension from the dialysis.  1. Altered mental status. This could be possibly due to an infection. The patient was hypotensive at dialysis. I will hold all hypertensive medications, broad-spectrum antibiotics including Zosyn and vancomycin given his endstage renal disease. Follow up on blood cultures and we are attempting to obtain a urinalysis.  2. Hypotension. We will hold all blood pressure medications. This could be due to sepsis/infection. Will follow closely. The patient will be on a very low dose of fluids.  3. Endstage renal disease on hemodialysis. Will go ahead and consult nephrology for further recommendations.  4. History of atrial fibrillation. The patient is not on Coumadin due to nosebleeds.  5. History of congestive heart failure, which seems  to be stable at this point.  6. History of pancreatic mass. The patient's family does not want work-up.  7. Right pleural effusion could be secondary to chronic congestive heart failure or possible pancreatic mass, could be due to a lung infection. At this time, I will order decubitus x-rays. Further work-up pending x-ray and discussion with the family. They may not want to be very aggressive with invasive work-up such as a thoracentesis.  8. Depression. We will continue on Zoloft.  9. The patient is a DO NOT RESUSCITATE status.   TIME SPENT: Approximately 55 minutes.   ____________________________ Donell Beers. Benjie Karvonen, MD spm:ap D: 04/04/2012 21:11:35 ET T: 04/05/2012 07:38:29 ET JOB#: [id number]  cc: Logan Vegh P. Benjie Karvonen, MD, <Dictator> Vianne Bulls. Arline Asp, MD Donell Beers Evin Loiseau MD ELECTRONICALLY SIGNED 04/05/2012 19:46

## 2015-03-17 NOTE — Discharge Summary (Signed)
PATIENT NAME:  Christian Short, Christian Short MR#:  [id number] DATE OF BIRTH:  [birth year]  DATE OF ADMISSION:  04/04/2012 DATE OF DISCHARGE:  04/06/2012  For a detailed note, please take a look at the history and physical done on admission by Dr. Bettey Costa.   DISCHARGE DIAGNOSES:  1. Altered mental status likely secondary to hypotension, much improved, suspected sepsis although not ruled out.  2. End-stage renal disease on hemodialysis Monday, Wednesday, and Friday. 3. Diabetes. 4. Hypertension. 5. Hyperlipidemia. 6. History of pancreatic mass with no further work-up warranted. 7. Pleural effusion.   DISCHARGE DIET: The patient is being discharged on a low-sodium, American Diabetic Association renal diet.   DISCHARGE ACTIVITY: As tolerated.  DISCHARGE FOLLOWUP: Followup with Dr. Lovie Macadamia in the next 1 to 2 weeks.   DISCHARGE MEDICATIONS:  1. Fish oil two caps twice a day. 2. Gemfibrozil 600 mg twice a day. 3. Metoprolol tartrate 50 mg twice a day. 4. Januvia 50 mg daily. 5. Zoloft 150 mg twice a day. 6. Lomotil as needed.  7. Colace 100 mg p.o. twice a day as needed.  8. Tylenol 1000 mg three times daily as needed. 9. Aspirin 81 mg daily.  10. Atorvastatin 40 mg daily.  11. Celebrex 200 mg daily.  12. Rena-Vite 1 tab daily.  13. Oyster shell calcium 500 mg daily.  14. Omeprazole 20 mg daily. 15. Renvela 800 mg three times daily with meals.   NOTE: The patient is to discontinue his Norvasc.   CONSULTANTS: Murlean Iba, MD - Nephrology.   PERTINENT STUDIES: CT scan of the head without contrast showed no acute intracranial process.   Chest x-ray on admission showed moderate right pleural effusion.   Right lateral decubitus x-ray showed moderate layering of right pleural effusion.   Blood culture is essentially negative.   HOSPITAL COURSE: This is an 81 year old male with medical problems as mentioned above who presented to the hospital on 04/04/2012 secondary to altered mental  status and lethargy.  1. Altered mental status/lethargy. This was likely thought to be related to hypotension. The patient developed altered mental status shortly after finishing his last dialysis. He was brought to the Emergency Room somewhat on the hypotensive side. There was some suspicion for sepsis and the patient was empirically started on vancomycin and Zosyn, although his blood cultures have remained negative. I discontinued his antibiotics and he has remained afebrile and hemodynamically stable. His antihypertensives are held and currently he has been taken off one of his antihypertensives. The patient has tolerated dialysis well today and is therefore being discharged back to his assisted living facility.  2. Suspected sepsis. This has currently now been ruled out. This was the suspicious diagnosis when the patient presented lethargic and hypotensive. He was empirically started on vancomycin and Zosyn, but those were discontinued the day after admission. The patient's blood cultures have remained negative and he is currently afebrile and hemodynamically stable.  3. End stage renal disease on hemodialysis. The patient did receive one dialysis on the day of discharge which he tolerated well. He will resume his dialysis as an outpatient.  4. Diabetes. There was no evidence of hypoglycemia explaining his altered mental status. He will resume his Januvia upon discharge.  5. Hypertension. As mentioned the patient was initially hypotensive when he presented. His Toprol and Norvasc were held. For now we will resume his Toprol but discontinue his Norvasc given his history of coronary artery disease.  6. Hyperlipidemia. The patient was maintained on his gemfibrozil and  atorvastatin. He will resume that upon discharge.  7. History of a pancreatic mass. The patient also was noted to have a pleural effusion, although the family does not want any aggressive work-up. The patient did not have any shortness of  breath or any tachypnea related to his effusion. If he does become symptomatic we can probably do a therapeutic thoracentesis as the patient does not want aggressive work-up at this point. The patient clinically is stable and therefore is being discharged back to his skilled nursing facility.   TIME SPENT ON DISCHARGE: 40 minutes. ____________________________ Belia Heman. Verdell Carmine, MD vjs:slb D: 04/06/2012 14:07:30 ET T: 04/06/2012 16:31:04 ET JOB#: [id number]  cc: Belia Heman. Verdell Carmine, MD, <Dictator> Youlanda Roys. Lovie Macadamia, MD Henreitta Leber MD ELECTRONICALLY SIGNED 04/07/2012 15:42
